# Patient Record
Sex: Male | Born: 1955
Health system: Southern US, Community
[De-identification: ages and names within clinical notes are randomized; demographics above are authoritative.]

## PROBLEM LIST (undated history)

## (undated) DIAGNOSIS — Z8249 Family history of ischemic heart disease and other diseases of the circulatory system: Secondary | ICD-10-CM

## (undated) DIAGNOSIS — F319 Bipolar disorder, unspecified: Secondary | ICD-10-CM

## (undated) DIAGNOSIS — I10 Essential (primary) hypertension: Secondary | ICD-10-CM

## (undated) DIAGNOSIS — Z720 Tobacco use: Secondary | ICD-10-CM

## (undated) DIAGNOSIS — E785 Hyperlipidemia, unspecified: Secondary | ICD-10-CM

## (undated) HISTORY — PX: TESTICLAR CYST EXCISION: SHX2492

## (undated) HISTORY — PX: KNEE ARTHROSCOPY: SHX127

## (undated) HISTORY — PX: HERNIA REPAIR: SHX51

## (undated) HISTORY — DX: Bipolar disorder, unspecified: F31.9

---

## 2007-04-30 ENCOUNTER — Ambulatory Visit: Payer: Self-pay | Admitting: Cardiology

## 2007-05-07 ENCOUNTER — Ambulatory Visit: Payer: Self-pay | Admitting: Cardiology

## 2010-11-23 NOTE — Assessment & Plan Note (Signed)
[facility]                          [facility] [facility] OFFICE NOTE   NAME:Wilkowski, Demone                       MRN:          [id number]  DATE:04/30/2007                            DOB:          Oct 08, 1955    CARDIOLOGIST:  Dr. Andee Lineman.   PRIMARY CARE PHYSICIAN:  Dr. Neita Carp.   SUMMARY OF HISTORY:  Mr. Revelo is a 55 year old white male who is  referred from Dr. Neita Carp in regards to chest discomfort.   Mr. Hinch describes a 2-1/43-month history of upper left anterior chest  cramping sensation with occasional radiation to the left neck and  shoulder, with occasional shortness of breath, nausea and vomiting on 1  episode, denies diaphoresis.  This primarily occurs with stress at work;  for example, when he is trying to do many activities at once and trying  to prioritize multiple problems.  It rarely occurs at home, but when it  does occur at home, it is under the same situation.  It usually lasts  approximately 5 minutes in duration.  He denies any rest exertional or  nocturia episode.  When the discomfort occurs, he will either usually  walk around or try to relax with relief.  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  This was the  episode associated with nausea and vomiting.   He feels these episodes are similar to the episodes he had many years  ago.  He was evaluated by Dr. Campbell Lerner in [place] with what sounds  like an echocardiogram and a stress Cardiolite.  The patient was told  that the lower left side of his heart was not getting enough blood;  however, this is nothing to worry about.  His last episode of chest  discomfort was approximately 3-4 weeks ago.   PAST MEDICAL HISTORY:  No known drug allergies.   CURRENT MEDICATIONS:  1. TriCor 145 mg daily.  2.  Celexa 20 mg daily.  3. Seroquel 300 mg daily.  4. Fish oil 1200 mg b.i.d.   PAST MEDICAL HISTORY:  1. Hyperlipidemia.  2. Depression.  3. He has also had a right cyst on his testicle removed in 1982.  4. Left knee surgery in 1989.  5. T&A at the age of 56.  6. He has been told in the past that his blood pressure has been high      and recommended blood pressure medications on at least 2 different      occasions; however, he states his blood pressure goes up and down,      although he does not check this at home.   He specifically denies any diabetes, COPD, CVA, myocardial infarction,  bleeding dyscrasias or thyroid dysfunction.   FAMILY HISTORY:  His mother and father are alive at the age of 81 and  97.  His father has had 2 myocardial infarctions.  He has a 51 year old  brother live  and well.   SOCIAL HISTORY:  He resides with his wife in [place].  He has 1 son, age  42, alive and well.  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  He smokes approximately a pack per day for 40 years.  He admits to alcohol, scotch, 3-4 times per week.  He denies any  problems associated with withdrawal.  He denies any drugs.  He does not  watch a specific diet.  He remains active in the yard; however, does not  have a specific exercise program.   REVIEW OF SYSTEMS:  Notable for snoring.  He denies any history of  obstructive sleep apnea, but he has not been evaluated.  Glasses.  Smoker's cough.  Occasional heartburn.  Arthralgias in his back, knees  and right hip.  Frequent kidney stones.  All other points are  unremarkable.   PHYSICAL EXAMINATION:  GENERAL:  A well-developed, well-nourished,  pleasant white male in no apparent distress.  Weight is 205 pounds, pulse 88, respirations 16, blood pressure 136/84  in the right arm.  HEENT:  Unremarkable except for glasses.  NECK:  Supple without thyromegaly, adenopathy, JVD or carotid bruits.  CHEST:  Symmetrical excursion.   Lung founds are diminished, but clear to  auscultation.  HEART:  PMI is not displaced.  Regular rate and rhythm.  I do not  appreciate any murmurs, rubs, clicks or gallops.  All pulses are  symmetrical and intact.  I did not appreciate any abdominal or femoral  bruits.  ABDOMEN:  Obese.  Bowel sounds present without organomegaly, masses or  tenderness.  EXTREMITIES:  Negative for cyanosis, clubbing or edema.  MUSCULOSKELETAL:  Grossly unremarkable.  NEUROLOGIC:  Unremarkable.   EKG in our office today shows normal sinus rhythm with a ventricular  rate of 80, normal axis, normal intervals, slightly delayed R-wave  progression, no acute changes.   IMPRESSION:  1. Atypical chest discomfort.  2. Probable hypertension.  3. History of hyperlipidemia and tobacco use and family history.   DISPOSITION:  Dr. Andee Lineman reviewed the patient's history, spoke with and  examined the patient and agrees with the above.  Dr. Andee Lineman also  reviewed with the patient in regards to further evaluation versus stress  Myoview versus cardiac catheterization and will proceed with stress  Myoview to further evaluate his discomfort.  If the stress Myoview is  abnormal, he will need recommendations for a cardiac catheterization.  We have counseled him on tobacco cessation.  We have also recommended to  Mr. Tabet to obtain a blood pressure cuff and begin a blood pressure  diary, as that he probably should be treated for hypertension.  I have  also counseled him in regards to a low-sodium diet and regular exercise.  We will have the stress Myoview arranged and further plans based on  findings.      Joellyn Rued, PA-C  Electronically Signed      Learta Codding, MD,FACC  Electronically Signed   EW/MedQ  DD: 04/30/2007  DT: 05/01/2007  Job #: [id number]   cc:   Fara Chute

## 2012-05-08 ENCOUNTER — Encounter (HOSPITAL_COMMUNITY): Payer: Self-pay | Admitting: Physician Assistant

## 2012-05-08 ENCOUNTER — Other Ambulatory Visit: Payer: Self-pay | Admitting: Physician Assistant

## 2012-05-08 ENCOUNTER — Observation Stay (HOSPITAL_COMMUNITY)
Admission: AD | Admit: 2012-05-08 | Discharge: 2012-05-08 | Disposition: A | Discharge status: Home / Self Care | Payer: BC Managed Care – PPO | Source: Other Acute Inpatient Hospital | Attending: Cardiovascular Disease | Admitting: Cardiovascular Disease

## 2012-05-08 ENCOUNTER — Encounter (HOSPITAL_COMMUNITY)
Admission: AD | Disposition: A | Discharge status: Home / Self Care | Payer: Self-pay | Source: Other Acute Inpatient Hospital | Attending: Cardiovascular Disease

## 2012-05-08 ENCOUNTER — Ambulatory Visit (HOSPITAL_COMMUNITY): Admit: 2012-05-08 | Payer: Self-pay | Admitting: Cardiovascular Disease

## 2012-05-08 DIAGNOSIS — R0789 Other chest pain: Secondary | ICD-10-CM | POA: Diagnosis present

## 2012-05-08 DIAGNOSIS — R079 Chest pain, unspecified: Principal | ICD-10-CM | POA: Insufficient documentation

## 2012-05-08 DIAGNOSIS — Z7982 Long term (current) use of aspirin: Secondary | ICD-10-CM | POA: Insufficient documentation

## 2012-05-08 DIAGNOSIS — R7989 Other specified abnormal findings of blood chemistry: Secondary | ICD-10-CM | POA: Diagnosis present

## 2012-05-08 DIAGNOSIS — I209 Angina pectoris, unspecified: Secondary | ICD-10-CM

## 2012-05-08 DIAGNOSIS — F319 Bipolar disorder, unspecified: Secondary | ICD-10-CM | POA: Insufficient documentation

## 2012-05-08 DIAGNOSIS — F17200 Nicotine dependence, unspecified, uncomplicated: Secondary | ICD-10-CM | POA: Insufficient documentation

## 2012-05-08 DIAGNOSIS — Z720 Tobacco use: Secondary | ICD-10-CM | POA: Diagnosis present

## 2012-05-08 DIAGNOSIS — I200 Unstable angina: Secondary | ICD-10-CM

## 2012-05-08 DIAGNOSIS — Z8249 Family history of ischemic heart disease and other diseases of the circulatory system: Secondary | ICD-10-CM

## 2012-05-08 DIAGNOSIS — Z79899 Other long term (current) drug therapy: Secondary | ICD-10-CM | POA: Insufficient documentation

## 2012-05-08 DIAGNOSIS — E785 Hyperlipidemia, unspecified: Secondary | ICD-10-CM | POA: Diagnosis present

## 2012-05-08 DIAGNOSIS — I10 Essential (primary) hypertension: Secondary | ICD-10-CM | POA: Diagnosis present

## 2012-05-08 HISTORY — DX: Family history of ischemic heart disease and other diseases of the circulatory system: Z82.49

## 2012-05-08 HISTORY — DX: Tobacco use: Z72.0

## 2012-05-08 HISTORY — PX: LEFT HEART CATHETERIZATION WITH CORONARY ANGIOGRAM: SHX5451

## 2012-05-08 HISTORY — DX: Hyperlipidemia, unspecified: E78.5

## 2012-05-08 HISTORY — DX: Essential (primary) hypertension: I10

## 2012-05-08 SURGERY — LEFT HEART CATHETERIZATION WITH CORONARY ANGIOGRAM
Anesthesia: LOCAL

## 2012-05-08 MED ORDER — HEPARIN SODIUM (PORCINE) 1000 UNIT/ML IJ SOLN
INTRAMUSCULAR | Status: AC
  Filled 2012-05-08: qty 1

## 2012-05-08 MED ORDER — SODIUM CHLORIDE 0.9 % IV SOLN: 1.0000 mL/kg/h | INTRAVENOUS | Status: DC

## 2012-05-08 MED ORDER — ASPIRIN 81 MG PO CHEW
CHEWABLE_TABLET | ORAL | Status: AC
  Filled 2012-05-08: qty 3

## 2012-05-08 MED ORDER — METOPROLOL TARTRATE 12.5 MG HALF TABLET
12.5000 mg | ORAL_TABLET | Freq: Two times a day (BID) | ORAL | Status: DC
  Filled 2012-05-08 (×2): qty 1

## 2012-05-08 MED ORDER — NITROGLYCERIN 0.2 MG/ML ON CALL CATH LAB
INTRAVENOUS | Status: AC
  Filled 2012-05-08: qty 1

## 2012-05-08 MED ORDER — LIDOCAINE HCL (PF) 1 % IJ SOLN
INTRAMUSCULAR | Status: AC
  Filled 2012-05-08: qty 30

## 2012-05-08 MED ORDER — NITROGLYCERIN 0.4 MG SL SUBL
0.4000 mg | SUBLINGUAL_TABLET | SUBLINGUAL | Status: DC | PRN
  Administered 2012-05-08: 0.4 mg via SUBLINGUAL

## 2012-05-08 MED ORDER — SODIUM CHLORIDE 0.9 % IV SOLN
INTRAVENOUS | Status: DC
Start: 2012-05-08 — End: 2012-05-08
  Administered 2012-05-08: 13:00:00 via INTRAVENOUS

## 2012-05-08 MED ORDER — MIDAZOLAM HCL 2 MG/2ML IJ SOLN
INTRAMUSCULAR | Status: AC
  Filled 2012-05-08: qty 2

## 2012-05-08 MED ORDER — NICOTINE 21 MG/24HR TD PT24: 1.0000 | MEDICATED_PATCH | Freq: Every day | TRANSDERMAL | Status: AC

## 2012-05-08 MED ORDER — NICOTINE 21 MG/24HR TD PT24: 21.0000 mg | MEDICATED_PATCH | Freq: Every day | TRANSDERMAL | Status: DC

## 2012-05-08 MED ORDER — DIAZEPAM 5 MG PO TABS
ORAL_TABLET | ORAL | Status: AC
  Administered 2012-05-08: 5 mg via ORAL
  Filled 2012-05-08: qty 1

## 2012-05-08 MED ORDER — ONDANSETRON HCL 4 MG/2ML IJ SOLN: 4.0000 mg | Freq: Four times a day (QID) | INTRAMUSCULAR | Status: DC | PRN

## 2012-05-08 MED ORDER — ACETAMINOPHEN 325 MG PO TABS: 650.0000 mg | ORAL_TABLET | ORAL | Status: DC | PRN

## 2012-05-08 MED ORDER — FENTANYL CITRATE 0.05 MG/ML IJ SOLN
INTRAMUSCULAR | Status: AC
  Filled 2012-05-08: qty 2

## 2012-05-08 MED ORDER — ASPIRIN 81 MG PO CHEW
243.0000 mg | CHEWABLE_TABLET | Freq: Once | ORAL | Status: AC
  Administered 2012-05-08: 243 mg via ORAL

## 2012-05-08 MED ORDER — VERAPAMIL HCL 2.5 MG/ML IV SOLN
INTRAVENOUS | Status: AC
  Filled 2012-05-08: qty 2

## 2012-05-08 MED ORDER — ASPIRIN 81 MG PO CHEW: 324.0000 mg | CHEWABLE_TABLET | ORAL | Status: DC

## 2012-05-08 MED ORDER — HEPARIN (PORCINE) IN NACL 2-0.9 UNIT/ML-% IJ SOLN
INTRAMUSCULAR | Status: AC
  Filled 2012-05-08: qty 1500

## 2012-05-08 MED ORDER — NITROGLYCERIN 0.4 MG SL SUBL
SUBLINGUAL_TABLET | SUBLINGUAL | Status: AC
  Filled 2012-05-08: qty 75

## 2012-05-08 MED ORDER — ASPIRIN EC 81 MG PO TBEC: 81.0000 mg | DELAYED_RELEASE_TABLET | Freq: Every day | ORAL | Status: DC

## 2012-05-08 MED ORDER — MORPHINE SULFATE 2 MG/ML IJ SOLN: 2.0000 mg | INTRAMUSCULAR | Status: DC | PRN

## 2012-05-08 MED ORDER — QUETIAPINE FUMARATE ER 50 MG PO TB24
100.0000 mg | ORAL_TABLET | Freq: Every day | ORAL | Status: DC
  Filled 2012-05-08: qty 2

## 2012-05-08 MED ORDER — CITALOPRAM HYDROBROMIDE 20 MG PO TABS: 20.0000 mg | ORAL_TABLET | Freq: Every day | ORAL | Status: DC

## 2012-05-08 MED ORDER — DIAZEPAM 5 MG PO TABS
5.0000 mg | ORAL_TABLET | ORAL | Status: AC
  Administered 2012-05-08: 5 mg via ORAL

## 2012-05-08 MED ORDER — METOPROLOL SUCCINATE ER 25 MG PO TB24: 25.0000 mg | ORAL_TABLET | Freq: Every day | ORAL | Status: DC

## 2012-05-08 MED ORDER — SODIUM CHLORIDE 0.9 % IV SOLN: INTRAVENOUS | Status: DC

## 2012-05-08 NOTE — Progress Notes (Addendum)
05/08/2012 6:02 PM Nursing note Spoke with [facility] PA-C and received verbal order ok to discharge patient one hour post return from cath lab per prior order from [facility]. Pt. Discharge avs form, medications already taken today and those due this evening given and explained to patient and wife. Location of called in RX given and explained to patient and family. Follow up appointments, vascular site care and when to call MD reviewed as well as activity restrictions. Pt. Verbalized understanding.  Questions and concerns addressed. D/c iv lines. D/c tele. D/c home per orders.

## 2012-05-08 NOTE — Progress Notes (Signed)
05/08/2012 1315 Nursing note Pt. Viewed educational video [phone]. Upon reassessment pt. States chest pain resolved at this point. Nathan Demark NP paged and asked for clarification for prep orders for cath as well as premedication if needed. Verbal orders received and enacted.  After consent signed by pt. Pre-med given as well as 243 mg. ASA due to pt. Already receiving 81 mg ASA this am at [facility] per verbal order from Nathan Demark NP. Pt. States his wife is on her way from [place]. At 1326 cath lab present to pick up patient for cath. Pt. Had RN at bedside during transfer to cath lab. Cath lab staff updated on all medications given and prep completed. Questions and concerns addressed.   Nathan Holt, [name]

## 2012-05-08 NOTE — CV Procedure (Signed)
   Cardiac Catheterization Procedure Note  Name: Nathan Holt MRN: [id number] DOB: 1956-01-01  Procedure: Left Heart Cath, Selective Coronary Angiography, LV angiography  Indication: Chest pain [name] 56 year old gentleman with multiple cardiovascular risk factors. It   Procedural Details: The right wrist was prepped, draped, and anesthetized with 1% lidocaine. Using the modified Seldinger technique, a 5 French sheath was introduced into the right radial artery. 3 mg of verapamil was administered through the sheath, weight-based unfractionated heparin was administered intravenously. Standard Judkins catheters were used for selective coronary angiography and left ventriculography. Catheter exchanges were performed over an exchange length guidewire. There were no immediate procedural complications. A TR band was used for radial hemostasis at the completion of the procedure.  The patient was transferred to the post catheterization recovery area for further monitoring.  Procedural Findings: Hemodynamics: AO 104/57 LV 118/8 False gradient secondary to catheter whip artifact  Coronary angiography: Coronary dominance: right  Left mainstem: The left mainstem is widely patent. There is no obstructive disease.  Left anterior descending (LAD): The LAD is widely patent the apex. There is large first diagonal and a small second diagonal without obstructive disease.  Left circumflex (LCx): Circumflex is widely patent. Anemia range without significant disease. The obtuse marginal branch is of large caliber without significant disease.  Right coronary artery (RCA): part. The RCA is dominant. The PDA and posterolateral branches are fairly large in caliber without significant disease. The acute marginal branch has no significant disease.  Left ventriculography: Left ventricular systolic function is normal, LVEF is estimated at 55-65%, there is no significant mitral regurgitation   Final Conclusions:     1. Widely patent coronary arteries with minimal irregularity of the RCA, but no stenoses are present 2. Normal LV function  Recommendations: Suspect noncardiac chest pain. Plan discharge home this afternoon.  [name] 05/08/2012, 2:17 PM

## 2012-05-08 NOTE — Discharge Summary (Signed)
CARDIOLOGY DISCHARGE SUMMARY   Patient ID: Nathan Holt MRN: [id number] DOB/AGE: October 05, 1955 56 y.o.  Admit date: 05/08/2012 Discharge date: 05/08/2012  Primary Discharge Diagnosis:    *Left-sided chest wall pain   Secondary Discharge Diagnosis:    Elevated d-dimer  . HTN (hypertension)   . Dyslipidemia     . Tobacco abuse   . Family history of early CAD    Procedures: Left Heart Cath, Selective Coronary Angiography, LV angiography  Hospital Course: Nathan Holt is a 56 year old male with a history of chest pain and a normal stress test (never cathed) who went to [facility] for chest pain.   He ruled out for MI. He had an elevated d-dimer but a CT angiogram was negative for PE. He was hypertensive and a BB was added to his medications as well as a nicotine patch. He was seen by Dr Myrtis Ser who felt the patient had multiple cardiac risk factors and concerning symptoms. He recommended cardiac catheterization and the patient was transferred to [facility] for cath.  The results are described below. There was no significant disease and his EF was preserved. Post-cath, he was ambulating without chest pain or SOB and considered stable for discharge, to follow up with primary care.   Cardiac Cath: 05/08/2012 Left mainstem: The left mainstem is widely patent. There is no obstructive disease.  Left anterior descending (LAD): The LAD is widely patent the apex. There is large first diagonal and a small second diagonal without obstructive disease.  Left circumflex (LCx): Circumflex is widely patent. Anemia range without significant disease. The obtuse marginal branch is of large caliber without significant disease.  Right coronary artery (RCA): part. The RCA is dominant. The PDA and posterolateral branches are fairly large in caliber without significant disease. The acute marginal branch has no significant disease.  Left ventriculography: Left ventricular systolic function is normal, LVEF is  estimated at 55-65%, there is no significant mitral regurgitation   EKG: 08-May-2012 13:05:48 [facility]-20 ROUTINE RECORD Normal sinus rhythm Normal ECG 76mm/s 65mm/mV 100Hz  8.0.1 12SL 241 HD CID: 1 Referred by: Nathan Holt Unconfirmed Vent. rate 72 BPM PR interval 136 ms QRS duration 86 ms QT/QTc 382/418 ms P-R-T axes 55 66 53  FOLLOW UP PLANS AND APPOINTMENTS Allergies  Allergen Reactions  . Crestor (Rosuvastatin)   . Lipitor (Atorvastatin)   . Tricor (Fenofibrate)   . Zocor (Simvastatin)      Medication List     As of 05/08/2012  4:02 PM    TAKE these medications         aspirin 81 MG tablet   Take 81 mg by mouth daily.      citalopram 20 MG tablet   Commonly known as: CELEXA   Take 20 mg by mouth daily.      metoprolol succinate 25 MG 24 hr tablet   Commonly known as: TOPROL-XL   Take 1 tablet (25 mg total) by mouth daily.      nicotine 21 mg/24hr patch   Commonly known as: NICODERM CQ - dosed in mg/24 hours   Place 1 patch onto the skin daily. Then go to 14 mg, then 7 mg and stop. NO tobacco.      QUEtiapine 100 MG tablet   Commonly known as: SEROQUEL   Take 100 mg by mouth at bedtime.         Follow-up Information    Schedule an appointment as soon as possible for a visit with [facility]  W, MD. (See within 2 weeks for a post-cath check.)    Contact information:   [address] [place] [address] [phone]       Follow up with Willa Rough, MD. (As needed)    Contact information:   [address] [place], [place] [phone]          BRING ALL MEDICATIONS WITH YOU TO FOLLOW UP APPOINTMENTS  Time spent with patient to include physician time: 39 min Signed: [name] 05/08/2012, 4:02 PM Co-Sign MD

## 2012-05-08 NOTE — H&P (Signed)
Nathan Holt, Nathan Holt ROOM: 209  UNIT NUMBER:  [id number] LOCATION: 10F 209 01 ADM/VISIT DATE:  05/07/2012   ADM PHYSKathaleen Grinder:  [id number] DOB: December 24, 1955   PRIMARY CARDIOLOGIST:  Dr. Willa Rough (new).  REFERRING PHYSICIAN:  Dr. Wende Crease, [facility] hospitalist.  PRIMARY PHYSICIAN:  Dr. Fara Chute.  REASON FOR CONSULTATION:  Mr. Nathan Holt is a 56 year old male, with no documented history of CAD, but multiple cardiac risk factors, with history of prior negative stress tests.  He presented to the emergency room yesterday with complaint of chest pain and "irregular" heartbeat, has ruled out for myocardial infarction with normal cardiac markers, and is now referred to Dr. Myrtis Ser for further evaluation.  Patient's cardiac risk factors are notable for HTN, dyslipidemia, long-standing tobacco smoking, family history, and age.  He had a normal adequate exercise stress Cardiolite in 2008, reviewed by Dr. Andee Lineman, for evaluation of chest pain.  Calculated ejection fraction 59%.  Patient also had prior cardiac evaluation in [place], [place] with Dr. Campbell Lerner, with both echocardiography and a stress test.  He has never undergone prior coronary angiography.  Patient presents with new onset, approximate 3-week history of intermittent left upper chest discomfort, referred to as "squeezing" with moderate intensity (6/10), and with 1 recent episode of radiation down the left arm into the elbow, while walking.  He has treated himself with 2 baby aspirin over these past several weeks, reporting relief fairly promptly within 20 minutes, but with subsequent recurrence of the discomfort the following day.  He also notes some exacerbation of the preexisting discomfort with walking, but not with deep inspiration or movement of the torso.  He also states that this current chest discomfort is different in quality from the sharp type of pains that he experienced in the past.  On morning of admission, he felt  "dizzy" and also had some mild chest discomfort.  He contacted Dr. Dian Situ office and was seen in the clinic, and had an EKG indicating NSR at 86 bpm with suggestion of old anteroseptal MI.  He was referred immediately to the ER, where he presented with a blood pressure of 144/109, pulse 84, and was afebrile.  He was treated with 4 baby aspirin, 1/2 inch nitro paste, and 25 mg of Lopressor.  As noted above, serial cardiac markers have all been within normal limits.  A D-dimer was elevated at 0.96, followed by a CT angiogram of the chest which was negative for pulmonary embolism.  No evidence of CHF either by chest x-ray or BNP (7).  Patient is currently hemodynamically stable, but does complain of some mild residual discomfort.  Admission EKG yesterday indicated normal sinus rhythm, again with suggestion of possible prior anteroseptal MI.  ALLERGIES:  No known drug allergies.  Intolerance to TriCor, Crestor, Lipitor, Zocor.  HOME MEDICATIONS: 1. Aspirin 81 q.d. 2. Celexa 20 q.d. 3. Seroquel 100 q.h.s.  PAST MEDICAL HISTORY: 1. HTN. 2. Dyslipidemia. 3. Tobacco. 4. Bipolar disorder.  SURGICAL HISTORY:  Right knee arthroscopy, testicle cyst resection, and hernia repair as a child.  SOCIAL HISTORY:  Patient lives in [place], [place] with his wife.  They have 1 grown son who is in the [employer], stationed in [place].  He smokes a pack a day, and started at the age of 51.  Drinks alcohol on occasion.  FAMILY HISTORY:  Father age 59, history of several prior MIs and subsequent CABG, with 1st MI at age 67.  REVIEW OF SYSTEMS:  Denies a history of  diabetes.  Denies symptoms suggestive of active reflux disease.  Otherwise as noted per HPI, the remaining systems reviewed and are negative.  PHYSICAL EXAMINATION:  Blood pressure currently 106/52, pulse 56, regular, respirations 16, temperature afebrile, sats 95% on 2 L, weight 220 pounds.  General:  A 56 year old male lying supine in no  distress.  HEENT:  Normocephalic atraumatic PERRLA EOMI.  Neck:  Palpable bilateral carotid pulses without bruits; no JVD at 30 degrees.  Lungs clear to auscultation all fields.  Heart regular rate, rhythm.  No significant murmurs.  No rubs or gallops.  Abdomen soft, intact bowel sounds.  Extremities palpable bilateral femoral pulses without bruits; palpable bilateral dorsalis pedis pulses.  No pedal edema.  Skin warm and dry.  Musculoskeletal no obvious deformity.  Neuro no focal deficit.  RADIOLOGIC STUDIES:  Admission chest x-ray:  No acute changes.  Admission EKG:  Normal sinus rhythm, 86 bpm, norm axis; question old anteroseptal MI; no acute changes.   No prior studies for comparison.  LABORATORY DATA:  Normal CPK/MB and troponins (3).  INR 0.9.  BNP 70.  D-dimer 0.96.  LFTs normal.  Triglycerides 410, total cholesterol 198, HDL 32, LDL not calculated.  Sodium 140, potassium 4.4, BUN 17, creatinine 1.1, glucose 100.  WBC 12,100, otherwise normal hemoglobin, hematocrit, and platelets.  CT angiogram of the chest:  Negative for pulmonary embolus; prior granulomatous disease.  IMPRESSION: 1. Crescendo angina pectoris. A. Normal cardiac markers. 2. Multiple cardiac risk factors. A. Hypertension. B. Dyslipidemia. C. Tobacco, long standing. D. Family history. E. Age. 3. Hypotension.  PLAN:  Recommendation is to transfer patient directly to [facility], so as to proceed with diagnostic coronary angiography and possible percutaneous intervention.  Patient presents with new onset chest discomfort with crescendo pattern, and continues to have some mild residual discomfort.  Patient has ruled out for MI with all serial cardiac markers within normal limits.  Twelve-lead EKG, however, is suggestive of possible prior anteroseptal MI.  Given all this, the recommendation was to defer a repeat stress test and proceed directly with a cardiac catheterization, with which the patient concurred.  The  risks/benefits of the procedure were discussed, in conjunction with Dr. Willa Rough.   __________________________    Rozell Searing, P.A. Alinda Money D: 05/08/2012 1025 T: 05/08/2012 1056 P: SER2 Patient seen and examined. I agree with the assessment and plan as detailed above. See also my additional thoughts below.   I am seeing the patient at [facility]. He has had intermittent chest discomfort for several days. He has not shown any definite proof of myocardial injury. There is decreased R wave in V2.  He has chest pain in the hospital this morning. We considered nuclear stress testing versus catheterization. With ongoing chest pain it is felt that the most appropriate study his catheterization. I personally discussed this with the patient and he agrees. He is transferred to [facility] for catheterization.  Willa Rough, MD, [facility] 05/08/2012 12:14 PM

## 2012-05-08 NOTE — Interval H&P Note (Signed)
History and Physical Interval Note:  05/08/2012 1:48 PM  Nathan Holt  has presented today for surgery, with the diagnosis of cp  The various methods of treatment have been discussed with the patient and family. After consideration of risks, benefits and other options for treatment, the patient has consented to  Procedure(s) (LRB) with comments: LEFT HEART CATHETERIZATION WITH CORONARY ANGIOGRAM (N/A) as a surgical intervention .  The patient's history has been reviewed, patient examined, no change in status, stable for surgery.  I have reviewed the patient's chart and labs.  Questions were answered to the patient's satisfaction.     [name]

## 2012-05-08 NOTE — Progress Notes (Addendum)
05/08/2012 1:04 PM Nursing note Upon arrival to floor via Care link pt c/o 2/10 chest pressure. EKG performed no acute changes noted from prior. Bp 111/74. 2Lo2 [place] applied. One SL NTG given to pt. Per orders. Will continue to closely monitor patient. Nathan Holt [facility] paged and made aware.  Orders received.  Topacio Cella, [name]

## 2013-05-14 ENCOUNTER — Other Ambulatory Visit (HOSPITAL_COMMUNITY): Payer: Self-pay | Admitting: Physician Assistant

## 2014-06-19 ENCOUNTER — Encounter (HOSPITAL_COMMUNITY): Payer: Self-pay | Admitting: Cardiovascular Disease

## 2018-03-30 ENCOUNTER — Encounter: Payer: Self-pay | Admitting: *Deleted

## 2018-04-02 ENCOUNTER — Telehealth: Payer: Self-pay | Admitting: Cardiology

## 2018-04-02 ENCOUNTER — Encounter

## 2018-04-02 ENCOUNTER — Encounter: Payer: Self-pay | Admitting: *Deleted

## 2018-04-02 ENCOUNTER — Ambulatory Visit (INDEPENDENT_AMBULATORY_CARE_PROVIDER_SITE_OTHER): Payer: BLUE CROSS/BLUE SHIELD | Admitting: Cardiology

## 2018-04-02 ENCOUNTER — Encounter: Payer: Self-pay | Admitting: Cardiology

## 2018-04-02 VITALS — BP 146/97 | HR 70 | Ht 69.0 in | Wt 197.8 lb

## 2018-04-02 DIAGNOSIS — R002 Palpitations: Secondary | ICD-10-CM

## 2018-04-02 DIAGNOSIS — R079 Chest pain, unspecified: Secondary | ICD-10-CM | POA: Diagnosis not present

## 2018-04-02 NOTE — Progress Notes (Signed)
Clinical Summary Mr. Rosalyn Chartersanes is a 62 y.o.male seen as new consult, referred by Dr Neita CarpSasser for chest pain and palpitations.   1. Palpitations - started about 3-4 months. Mainly at night, feeling of heart fluttering. Can last up 30 seconds, feels SOB. Occasional chest pain associated, left sided into neck. Was occurring nightly, recently less frequent without episode x 2-3 minutes.  - coffee x 2 cups, iced tea few glasses in the evening, no sodas, no energy drinks, no EtoH - has been on metoprolol 15 years for bp he reports.      2. Chest pain - started about 3 months ago. Can occur at rest or with activity. Cramping like feeling left chest, 5/10 in severity. No other symptoms. Can last up to 24 hrs. Not positional. No episodes over the last few weeks - no relation to food - heavies activity is yardwork, can have some increased fatigue  - no recent edema  CAD risk factors: hyperlipidema, HTN, +tobacco 50, father MI mid 3760s, paternal grandfather MI in his 5940s, paternal uncle MI 6050.  -  cath in 2013 normal arteries.   Past Medical History:  Diagnosis Date  . Bipolar depression (HCC)   . Dyslipidemia   . Family history of early CAD   . HTN (hypertension)   . Tobacco abuse      Allergies  Allergen Reactions  . Crestor [Rosuvastatin]   . Lipitor [Atorvastatin]   . Tricor [Fenofibrate]   . Zocor [Simvastatin]      Current Outpatient Medications  Medication Sig Dispense Refill  . aspirin 81 MG tablet Take 81 mg by mouth daily.    . citalopram (CELEXA) 20 MG tablet Take 20 mg by mouth daily.    . ergocalciferol (VITAMIN D2) 50000 units capsule Take 1 capsule by mouth once a week.    . fenofibrate (TRICOR) 145 MG tablet Take 1 tablet by mouth daily.    . metoprolol succinate (TOPROL-XL) 25 MG 24 hr tablet TAKE 1 TABLET BY MOUTH EVERY DAY 30 tablet 10  . nicotine (NICODERM CQ - DOSED IN MG/24 HOURS) 21 mg/24hr patch Place 1 patch onto the skin daily. Then go to 14 mg, then 7  mg and stop. NO tobacco. 14 patch 0  . QUEtiapine (SEROQUEL) 100 MG tablet Take 100 mg by mouth at bedtime.    . Turmeric 500 MG TABS Take 1 tablet by mouth daily.    . vitamin C (ASCORBIC ACID) 500 MG tablet Take 1 tablet by mouth daily.     No current facility-administered medications for this visit.         Allergies  Allergen Reactions  . Crestor [Rosuvastatin]   . Lipitor [Atorvastatin]   . Tricor [Fenofibrate]   . Zocor [Simvastatin]       Family History  Problem Relation Age of Onset  . Heart attack Father   . Colon cancer Brother      Social History Mr. Rosalyn Chartersanes reports that he has been smoking cigarettes. He has been smoking about 1.00 pack per day. He does not have any smokeless tobacco history on file. Mr. Rosalyn Chartersanes has no alcohol history on file.   Review of Systems CONSTITUTIONAL: No weight loss, fever, chills, weakness or fatigue.  HEENT: Eyes: No visual loss, blurred vision, double vision or yellow sclerae.No hearing loss, sneezing, congestion, runny nose or sore throat.  SKIN: No rash or itching.  CARDIOVASCULAR: per hpi RESPIRATORY: No shortness of breath, cough or sputum.  GASTROINTESTINAL: No  anorexia, nausea, vomiting or diarrhea. No abdominal pain or blood.  GENITOURINARY: No burning on urination, no polyuria NEUROLOGICAL: No headache, dizziness, syncope, paralysis, ataxia, numbness or tingling in the extremities. No change in bowel or bladder control.  MUSCULOSKELETAL: No muscle, back pain, joint pain or stiffness.  LYMPHATICS: No enlarged nodes. No history of splenectomy.  PSYCHIATRIC: No history of depression or anxiety.  ENDOCRINOLOGIC: No reports of sweating, cold or heat intolerance. No polyuria or polydipsia.  Marland Kitchen   Physical Examination Vitals:   04/02/18 0831 04/02/18 0832  BP: (!) 143/91 (!) 146/97  Pulse: 76 70  SpO2: 98% 98%   Vitals:   04/02/18 0821  Weight: 197 lb 12.8 oz (89.7 kg)  Height: 5\' 9"  (1.753 m)    Gen: resting  comfortably, no acute distress HEENT: no scleral icterus, pupils equal round and reactive, no palptable cervical adenopathy,  CV: RRR, no m/r/g, no jvd Resp: Clear to auscultation bilaterally GI: abdomen is soft, non-tender, non-distended, normal bowel sounds, no hepatosplenomegaly MSK: extremities are warm, no edema.  Skin: warm, no rash Neuro:  no focal deficits Psych: appropriate affect   Assessment and Plan  1. Palpitations - EKG from pcp shows normal sinus rhythm - wean caffeine and monitor symptoms, if persist plan for home event monitor likely 30 days  2. Chest pain - somewhat atypical symptoms. He has several strong CAD risk factors however. Plan for exercise nuclear stress test to further evaluate.    F/u pending stress results. If ongoing palitations with decreased caffeine order 30 day event monitor.    Antoine Poche, M.D.,

## 2018-04-02 NOTE — Telephone Encounter (Signed)
°  Precert needed for: EXERCISE NUC STRESS TEST   Location: [facility]   Date: Sept 27, 2019 arrive at 8:30

## 2018-04-02 NOTE — Patient Instructions (Signed)
Your physician recommends that you schedule a follow-up appointment in: PENDING TEST RESULTS WITH DR BRANCH  Your physician recommends that you continue on your current medications as directed. Please refer to the Current Medication list given to you today.  Your physician has requested that you have en exercise stress myoview. For further information please visit [email or web address]. Please follow instruction sheet, as given.  Thank you for choosing [facility]!!    

## 2018-04-06 ENCOUNTER — Encounter (HOSPITAL_COMMUNITY): Payer: Self-pay

## 2018-04-06 ENCOUNTER — Encounter: Payer: Self-pay | Admitting: Physician Assistant

## 2018-04-06 ENCOUNTER — Ambulatory Visit (HOSPITAL_COMMUNITY)
Admission: RE | Admit: 2018-04-06 | Discharge: 2018-04-06 | Disposition: A | Discharge status: Home / Self Care | Payer: BLUE CROSS/BLUE SHIELD | Source: Ambulatory Visit | Attending: Cardiology | Admitting: Cardiology

## 2018-04-06 ENCOUNTER — Encounter (HOSPITAL_COMMUNITY)
Admission: RE | Admit: 2018-04-06 | Discharge: 2018-04-06 | Disposition: A | Discharge status: Home / Self Care | Payer: BLUE CROSS/BLUE SHIELD | Source: Ambulatory Visit | Attending: Cardiology | Admitting: Cardiology

## 2018-04-06 DIAGNOSIS — R079 Chest pain, unspecified: Secondary | ICD-10-CM | POA: Insufficient documentation

## 2018-04-06 DIAGNOSIS — I472 Ventricular tachycardia: Secondary | ICD-10-CM | POA: Diagnosis not present

## 2018-04-06 DIAGNOSIS — I493 Ventricular premature depolarization: Secondary | ICD-10-CM | POA: Insufficient documentation

## 2018-04-06 LAB — NM MYOCAR MULTI W/SPECT W/WALL MOTION / EF
Estimated workload: 10.1 METS
Exercise duration (min): 7 min
Exercise duration (sec): 32 s
LV dias vol: 74 mL (ref 62–150)
LV sys vol: 26 mL
MPHR: 159 {beats}/min
Peak HR: 150 {beats}/min
Percent HR: 94 %
RATE: 0.34
RPE: 12
Rest HR: 60 {beats}/min
SDS: 0
SRS: 6
SSS: 6
TID: 0.79

## 2018-04-06 MED ORDER — SODIUM CHLORIDE 0.9% FLUSH
INTRAVENOUS | Status: AC
  Administered 2018-04-06: 10 mL via INTRAVENOUS
  Filled 2018-04-06: qty 10

## 2018-04-06 MED ORDER — REGADENOSON 0.4 MG/5ML IV SOLN
INTRAVENOUS | Status: AC
  Filled 2018-04-06: qty 5

## 2018-04-06 MED ORDER — TECHNETIUM TC 99M TETROFOSMIN IV KIT
30.0000 | PACK | Freq: Once | INTRAVENOUS | Status: AC | PRN
  Administered 2018-04-06: 31 via INTRAVENOUS

## 2018-04-06 MED ORDER — TECHNETIUM TC 99M TETROFOSMIN IV KIT
10.0000 | PACK | Freq: Once | INTRAVENOUS | Status: AC | PRN
  Administered 2018-04-06: 11 via INTRAVENOUS

## 2018-04-06 NOTE — Progress Notes (Signed)
Patient seen in nuc lab. Exercised well on treadmill without angina or dyspnea. Test stopped at 7:32 due to leg fatigue (Lexi injected around 6:32). THR was achieved. Just before stopping TM, pt had 5 beat run NSVT, asymptomatic. TM EKGs otherwise showed subtle upsloping ST depression inferiorly and V5-V6. D/w Dr. Purvis Sheffield (DOD) who recommended to continue beta blocker and await images. He felt pt was OK to go if feeling well. I did ask Mr. Kerlin to refrain from strenuous activity/exercise until he hears back from cardiologist about interpretation. He was recently seen for palpitations. He says he has not had any for several weeks but event monitor might be considered at discretion of primary cardiologist given run of NSVT contingent on nuc interpretation.  Will route to Dr. Wyline Mood as Ruby Cola PA-C

## 2018-04-06 NOTE — Progress Notes (Signed)
Thx for the update, I will follow up on the final stress report and go from there.   Dominga Ferry MD

## 2018-04-09 ENCOUNTER — Telehealth: Payer: Self-pay | Admitting: *Deleted

## 2018-04-09 DIAGNOSIS — I493 Ventricular premature depolarization: Secondary | ICD-10-CM

## 2018-04-09 NOTE — Telephone Encounter (Signed)
-----   Message from Antoine Poche, MD sent at 04/09/2018  2:16 PM EDT ----- Stress test does not show clear evidence of blockages. There were some extra heart beats at times called PVCs. Sometimes these just occur, sometimes they can be a sign of underlying weakness of the heart. We should do some further evaluation, can we please order an echocardiogram for him for PVCs.   Dominga Ferry MD

## 2018-04-09 NOTE — Telephone Encounter (Signed)
Pt voice understanding and agreeable to echo - orders placed and will forward to schedulers - routed to pcp

## 2018-05-17 ENCOUNTER — Other Ambulatory Visit: Payer: BLUE CROSS/BLUE SHIELD

## 2018-05-31 ENCOUNTER — Other Ambulatory Visit: Payer: Self-pay | Admitting: Cardiology

## 2018-05-31 DIAGNOSIS — I493 Ventricular premature depolarization: Secondary | ICD-10-CM

## 2018-05-31 DIAGNOSIS — R079 Chest pain, unspecified: Secondary | ICD-10-CM

## 2018-06-13 ENCOUNTER — Other Ambulatory Visit: Payer: BLUE CROSS/BLUE SHIELD

## 2018-06-26 ENCOUNTER — Telehealth: Payer: Self-pay | Admitting: *Deleted

## 2018-06-26 NOTE — Telephone Encounter (Signed)
F/u from [facility] in September - pt cx echo and says he wanted to have it done at the [facility] - now says he hasn't had echo done yet however [facility] for future cardiac f/u

## 2019-04-01 IMAGING — NM NM MYOCAR MULTI W/SPECT W/WALL MOTION & EF
2 series · 12 of 12 positions shown · non-contrast
Comparison: none

[Series 1: rest · 6.51mm/px · 6 of 64 frames shown]
[frame 6/64]
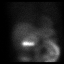
[frame 16/64]
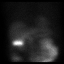
[frame 27/64]
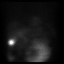
[frame 38/64]
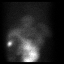
[frame 48/64]
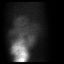
[frame 59/64]
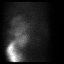

[Series 3: stress gated - perfusion · 6.51mm/px · 6 of 64 frames shown]
[frame 6/64]
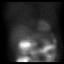
[frame 16/64]
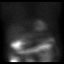
[frame 27/64]
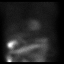
[frame 38/64]
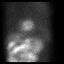
[frame 48/64]
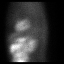
[frame 59/64]
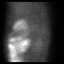

[12 of 12 positions shown; findings below may reference images not displayed]

Canned report from images found in remote index.

Refer to host system for actual result text.
